# Patient Record
Sex: Male | Born: 1990 | Race: White | Hispanic: No | Marital: Married | Smoking: Current every day smoker
Health system: Southern US, Community
[De-identification: ages and names within clinical notes are randomized; demographics above are authoritative.]

## PROBLEM LIST (undated history)

## (undated) HISTORY — PX: WRIST SURGERY: SHX841

---
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
## 2011-01-21 NOTE — Progress Notes (Signed)
Summary: NECK AND BACK PAIN/SPOT ON NECK (room 4)   Vital Signs:  Patient Profile:   20 Years Old Male CC:      sore on left side neck x 5 days; radiating pain to back; lethargy and general aches Height:     69 inches Weight:      205.50 pounds O2 Sat:      98 % O2 treatment:    Room Air Temp:     99.4 degrees F oral Pulse rate:   95 / minute Resp:     16 per minute BP sitting:   123 / 78  (left arm) Cuff size:   large  Pt. in pain?   yes    Location:   left side neck/ back                   Updated Prior Medication List: No Medications Current Allergies: No known allergies History of Present Illness Chief Complaint: sore on left side neck x 5 days; radiating pain to back; lethargy and general aches History of Present Illness:  Subjective:  Patient complains of onset of a tender bump on his left supraclavicular area about 4 to 5 days ago, followed by surrounding redness that has gradually receded.  He has had associated soreness in his left shoulder and upper left back muscles, fatigue, chills/sweats, and fever to 99.5 for two days.  He has had anorexia, mild nausea without vomiting, and a mild headache.  No arthralgias or myalgias.  No other rash.  No known tick bites althought he has dogs and [place] area.  No GU or respiratory symptoms   REVIEW OF SYSTEMS Constitutional Symptoms      Denies fever, chills, night sweats, weight loss, weight gain, and fatigue.  Eyes       Denies change in vision, eye pain, eye discharge, glasses, contact lenses, and eye surgery. Ear/Nose/Throat/Mouth       Complains of sinus problems.      Denies hearing loss/aids, change in hearing, ear pain, ear discharge, dizziness, frequent runny nose, frequent nose bleeds, sore throat, hoarseness, and tooth pain or bleeding.  Respiratory       Denies dry cough, productive cough, wheezing, shortness of breath, asthma, bronchitis, and emphysema/COPD.  Cardiovascular       Denies murmurs,  chest pain, and tires easily with exhertion.    Gastrointestinal       Denies stomach pain, nausea/vomiting, diarrhea, constipation, blood in bowel movements, and indigestion. Genitourniary       Denies painful urination, kidney stones, and loss of urinary control. Neurological       Denies paralysis, seizures, and fainting/blackouts. Musculoskeletal       Complains of muscle pain, joint pain, and muscle weakness.      Denies joint stiffness, decreased range of motion, redness, swelling, and gout.  Skin       Complains of unusual moles/lumps or sores.      Denies bruising and hair/skin or nail changes.      Comments: left side neck Psych       Denies mood changes, temper/anger issues, anxiety/stress, speech problems, depression, and sleep problems. Other Comments: sore on Left Neck x 5 days ago; progressed to joint/general aches and lethargy with back stiffness   Past History:  Family History: Last updated: 01/03/2010 None  Social History: Last updated: 08/26/2010 Current Smoker 1/2 PPD Alcohol use-yes Drug use-no 3 dogs; [place]  Past Medical History: Reviewed history  from 01/03/2010 and no changes required. Hx of few migraines  Past Surgical History: Denies surgical history  Social History: Current Smoker 1/2 PPD Alcohol use-yes Drug use-no 3 dogs; [place]   Objective:  Appearance:  Patient appears healthy, stated age, and in no acute distress  Eyes:  Pupils are equal, round, and reactive to light and accomodation.  Extraocular movement is intact.  Conjunctivae are not inflamed.  Ears:  Canals normal.  Tympanic membranes normal.   Nose:  Mildly congested turbinates.  No sinus tenderness  Mouth:  No lesions Pharynx:  Normal  Neck:  Supple.  No adenopathy is present.  No thyromegaly is present  Lungs:  Clear to auscultation.  Breath sounds are equal.  Heart:  Regular rate and rhythm without murmurs, rubs, or gallops.  Abdomen:   Nontender without masses or hepatosplenomegaly.  Bowel sounds are present.  No CVA or flank tenderness.  Extremities:  No edema.   Skin:  on the left supraclavicular area is a 5mm by 7mm eschar surrounded by a faint halo of erythema.  No discharge from the lesion.  No fluctuance.  No swelling.  The area is mildly tender but no supraclicular adenopathy palpated.  No axillary adenopathy.  No skin rashes noted otherwise. CBC:  WBC 5.1 ; LY 34.6, MO 11.3, GR 54.1; Hgb 15.1  Assessment New Problems: FATIGUE, ACUTE (ICD-780.79) SKIN LESION (ICD-709.9) FEVER UNSPECIFIED (ICD-780.60)  ? TICK BORNE ILLNESS  Plan New Medications/Changes: CYCLOBENZAPRINE HCL 10 MG TABS (CYCLOBENZAPRINE HCL) One tab by mouth two to three times daily as needed  #20 x 1, 08/26/2010, Donna Christen MD DOXYCYCLINE HYCLATE 100 MG CAPS (DOXYCYCLINE HYCLATE) One by mouth two times a day  #20 x 0, 08/26/2010, Donna Christen MD  New Orders: T-Lyme Disease [phone] T-Rock[place] Mtn Spotted Fever AB IgG IgM [09811-91478] CBC w/Diff [29562-13086] Services provided After hours-Weekends-Holidays [99051] Est. Patient Level IV [57846] Planning Comments:   Check RMSF & Lyme Disease titiers. Empirically begin doxycycline.  Continue Aleve, 2 tabs every 12 hours.  Rest, increase fluid intake. Follow-up with PCP if not improving   The patient and/or caregiver has been counseled thoroughly with regard to medications prescribed including dosage, schedule, interactions, rationale for use, and possible side effects and they verbalize understanding.  Diagnoses and expected course of recovery discussed and will return if not improved as expected or if the condition worsens. Patient and/or caregiver verbalized understanding.  Prescriptions: CYCLOBENZAPRINE HCL 10 MG TABS (CYCLOBENZAPRINE HCL) One tab by mouth two to three times daily as needed  #20 x 1   Entered and Authorized by:   Donna Christen MD   Signed by:   Donna Christen MD on  08/26/2010   Method used:   Print then Give to Patient   RxID:   [id number] DOXYCYCLINE HYCLATE 100 MG CAPS (DOXYCYCLINE HYCLATE) One by mouth two times a day  #20 x 0   Entered and Authorized by:   Donna Christen MD   Signed by:   Donna Christen MD on 08/26/2010   Method used:   Print then Give to Patient   RxID:   [id number]   Orders Added: 1)  T-Lyme Disease [95638-75643] 2)  T-Rocky Mtn Spotted Fever AB IgG IgM [[id number]] 3)  CBC w/Diff [60630-16010] 4)  Services provided After hours-Weekends-Holidays [99051] 5)  Est. Patient Level IV [93235]

## 2011-01-21 NOTE — Telephone Encounter (Signed)
  Phone Note Call from Patient   Caller: Patient Summary of Call: spoke to pt and given lab results. he states that he is feeling better. advised him to call back if he has any questions or concerns. Initial call taken by: Clemens Catholic LPN,  August 31, 2010 4:36 PM Call placed by: Clemens Catholic LPN,  August 31, 2010 4:35 PM Call placed to: Patient

## 2011-09-10 ENCOUNTER — Emergency Department
Admission: EM | Admit: 2011-09-10 | Discharge: 2011-09-10 | Disposition: A | Discharge status: Home / Self Care | Payer: Managed Care, Other (non HMO) | Source: Home / Self Care | Attending: Family Medicine | Admitting: Family Medicine

## 2011-09-10 ENCOUNTER — Encounter: Payer: Self-pay | Admitting: *Deleted

## 2011-09-10 ENCOUNTER — Emergency Department (INDEPENDENT_AMBULATORY_CARE_PROVIDER_SITE_OTHER): Payer: Managed Care, Other (non HMO)

## 2011-09-10 DIAGNOSIS — M545 Low back pain: Secondary | ICD-10-CM

## 2011-09-10 DIAGNOSIS — M546 Pain in thoracic spine: Secondary | ICD-10-CM

## 2011-09-10 DIAGNOSIS — X008XXA Other exposure to uncontrolled fire in building or structure, initial encounter: Secondary | ICD-10-CM

## 2011-09-10 DIAGNOSIS — IMO0002 Reserved for concepts with insufficient information to code with codable children: Secondary | ICD-10-CM

## 2011-09-10 MED ORDER — FLUCONAZOLE 150 MG PO TABS: 150.0000 mg | ORAL_TABLET | Freq: Once | ORAL | Status: AC

## 2011-09-10 MED ORDER — GABAPENTIN 300 MG PO CAPS: ORAL_CAPSULE | ORAL | Status: DC
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
## 2011-09-10 NOTE — ED Notes (Addendum)
Pt c/o low to mid back pain x 2 mths, denies injury.  He states that the pain is worse at night. He has taken aleve with no relief.

## 2011-09-12 ENCOUNTER — Ambulatory Visit: Payer: Managed Care, Other (non HMO) | Admitting: Physical Therapy

## 2012-05-21 ENCOUNTER — Emergency Department
Admission: EM | Admit: 2012-05-21 | Discharge: 2012-05-21 | Disposition: A | Discharge status: Home / Self Care | Payer: Managed Care, Other (non HMO) | Source: Home / Self Care | Attending: Family Medicine | Admitting: Family Medicine

## 2012-05-21 ENCOUNTER — Encounter: Payer: Self-pay | Admitting: *Deleted

## 2012-05-21 DIAGNOSIS — R1013 Epigastric pain: Secondary | ICD-10-CM

## 2012-05-21 DIAGNOSIS — R51 Headache: Secondary | ICD-10-CM

## 2012-05-21 LAB — POCT CBC W AUTO DIFF (K'VILLE URGENT CARE)

## 2012-05-21 MED ORDER — OMEPRAZOLE 40 MG PO CPDR: 40.0000 mg | DELAYED_RELEASE_CAPSULE | Freq: Every day | ORAL | Status: DC

## 2012-05-21 MED ORDER — BUTALBITAL-APAP-CAFFEINE 50-325-40 MG PO TABS: 1.0000 | ORAL_TABLET | Freq: Four times a day (QID) | ORAL | Status: AC | PRN

## 2012-05-21 NOTE — ED Notes (Signed)
Azul c/o lower bilateral abdominal pain x 4 days. Pain is intermittent and only during the day. He has been on Amoxicillin for 7 days now for a uri and has been taking a supplement "Juice Plus" for 1 month. C/o some nausea and diarrhea with the pain. Denies any fever or chills.

## 2012-05-21 NOTE — ED Provider Notes (Signed)
History     CSN: [id number]  Arrival date & time 05/21/12  1159   First MD Initiated Contact with Patient 05/21/12 1241      Chief Complaint  Patient presents with  . Abdominal Pain       HPI Comments: Patient states that he ate a pizza four days ago and developed lower abdominal pain afterwards that lasted about 6 hours.  He has had recurring abdominal discomfort during the day.  He has also had intermittent nausea and loose stools when the pain occurs.  No vomiting.  No fevers, chills, and sweats.  No urinary symptoms.  He states that he started taking amoxicillin about 9 days ago for a respiratory infection and has one capsule left.  No hematochezia. He also complains of flare-up of migraines since he has had the abdominal pain.  Patient is a 22 y.o. male presenting with abdominal pain. The history is provided by the patient.  Abdominal Pain Pain location:  Generalized Pain quality: sharp   Pain radiates to:  Does not radiate Pain severity:  Moderate Onset quality:  Sudden Duration:  4 days Timing:  Intermittent Progression:  Unchanged Chronicity:  New Context: eating   Relieved by:  Nothing Worsened by:  Nothing tried Associated symptoms: diarrhea, fatigue and nausea   Associated symptoms: no chills, no constipation, no dysuria, no fever, no hematemesis, no hematochezia, no hematuria, no melena and no vomiting     History reviewed. No pertinent past medical history.  Past Surgical History  Procedure Laterality Date  . Wrist surgery      Family History  Problem Relation Age of Onset  . Hypertension Father     History  Substance Use Topics  . Smoking status: Current Every Day Smoker -- 0.50 packs/day for 3 years    Types: Cigarettes  . Smokeless tobacco: Never Used  . Alcohol Use: No      Review of Systems  Constitutional: Positive for fatigue. Negative for fever and chills.  Gastrointestinal: Positive for nausea, abdominal pain and diarrhea. Negative for  vomiting, constipation, melena, hematochezia and hematemesis.  Genitourinary: Negative for dysuria and hematuria.    Allergies  Review of patient's allergies indicates no known allergies.  Home Medications   Current Outpatient Rx  Name  Route  Sig  Dispense  Refill  . butalbital-acetaminophen-caffeine (FIORICET) 50-325-40 MG per tablet   Oral   Take 1 tablet by mouth every 6 (six) hours as needed for headache.   15 tablet   0   . omeprazole (PRILOSEC) 40 MG capsule   Oral   Take 1 capsule (40 mg total) by mouth daily. Take 30 minutes before a meal.   15 capsule   0     BP 125/77  Pulse 86  Temp(Src) 98.2 F (36.8 C) (Oral)  Resp 16  Ht 5\' 9"  (1.753 m)  Wt 204 lb (92.534 kg)  BMI 30.11 kg/m2  SpO2 98%  Physical Exam Nursing notes and Vital Signs reviewed. Appearance:  Patient appears healthy, stated age, and in no acute distress Eyes:  Pupils are equal, round, and reactive to light and accomodation.  Extraocular movement is intact.  Conjunctivae are not inflamed  Ears:  Canals normal.  Tympanic membranes normal.  Nose:   Normal turbinates.  No sinus tenderness.   Pharynx:  Normal Neck:  Supple.  No adenopathy Lungs:  Clear to auscultation.  Breath sounds are equal.  Heart:  Regular rate and rhythm without murmurs, rubs, or gallops.  Abdomen:  Mild peri-umbilical tenderness without masses or hepatosplenomegaly.  Bowel sounds are present and increased.  No CVA or flank tenderness.  Extremities:  No edema.  No calf tenderness Skin:  No rash present.   ED Course  Procedures none  Labs Reviewed  POCT CBC W AUTO DIFF ([facility])  WBC 11.9; LY 24.7; MO 9.9; GR 65.4; Hgb 15.8; Platelets 192       1. Abdominal pain, epigastric; ? etiology   2. Headache       MDM  Begin clear liquids for about 12 to 18 hours, then may begin a BRAT diet. Then gradually advance to a regular diet as tolerated.  Discontinue amoxicillin.  Begin eating yogurt with live culture  ("probiotic").  If pain and diarrhea persist, recommend stool culture.  .  If symptoms become significantly worse during the night or over the weekend, proceed to the local emergency room.        Lattie Haw, MD 05/25/12 [phone]

## 2013-01-12 IMAGING — CR DG LUMBAR SPINE COMPLETE 4+V
5 series · 5 of 5 positions shown · non-contrast
Comparison: 09/10/2011

CLINICAL DATA: Back injury 3 months ago, persistent low back pain

LUMBAR SPINE - COMPLETE 4+ VIEW

[view not recorded (1 of 5)]
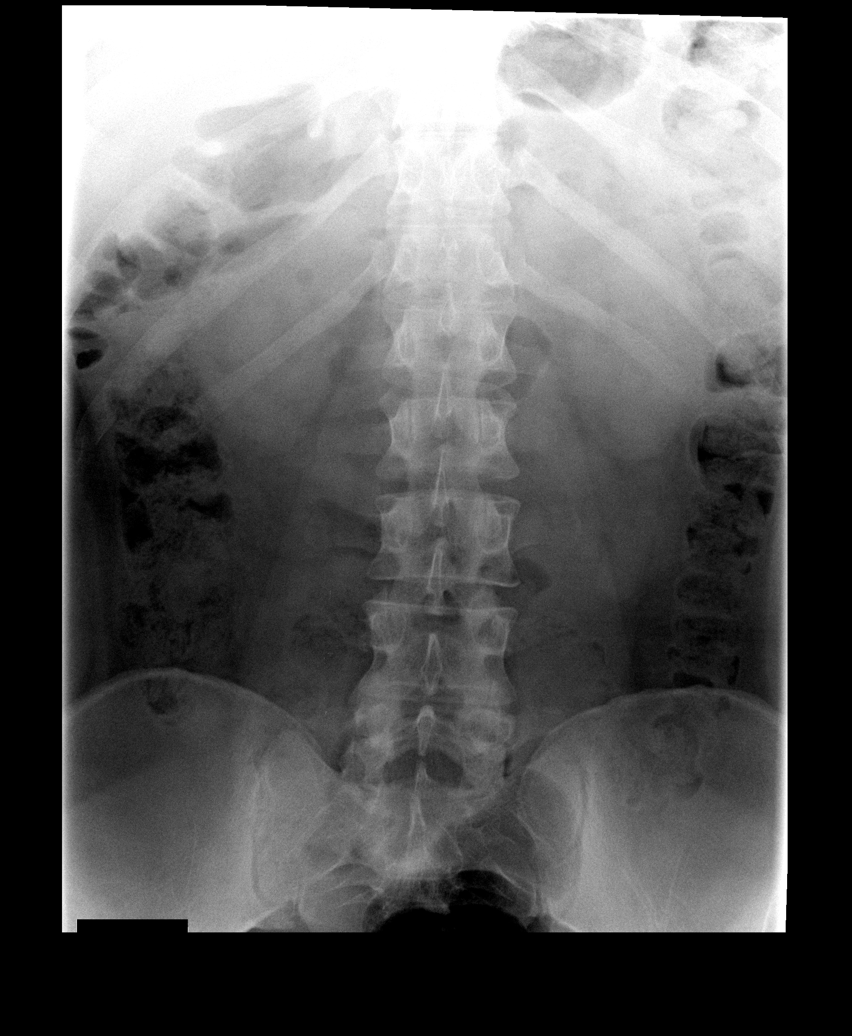

[view not recorded (2 of 5)]
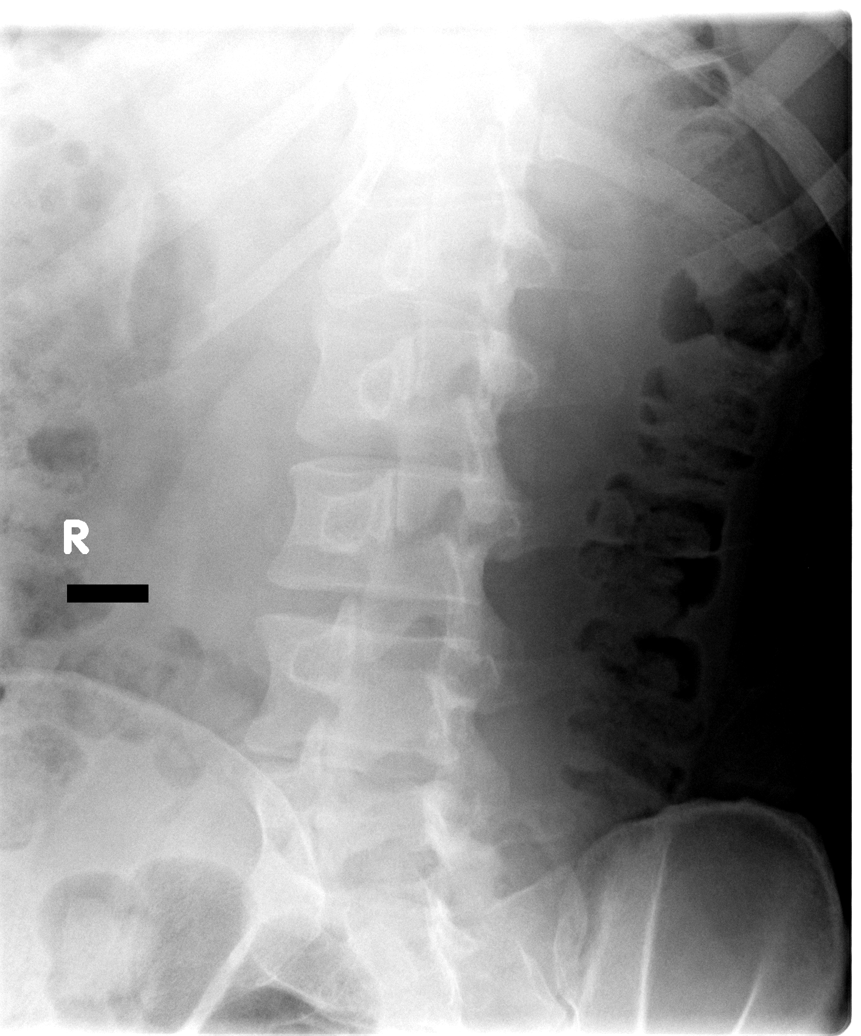

[view not recorded (3 of 5)]
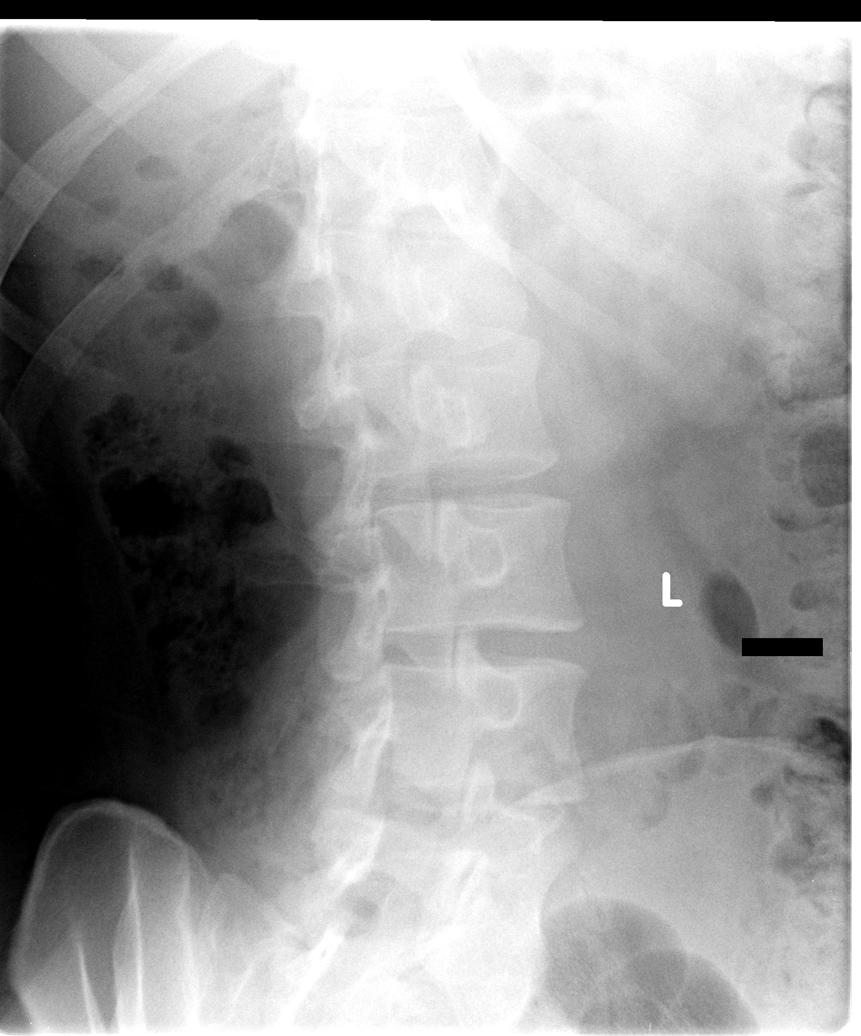

[view not recorded (4 of 5)]
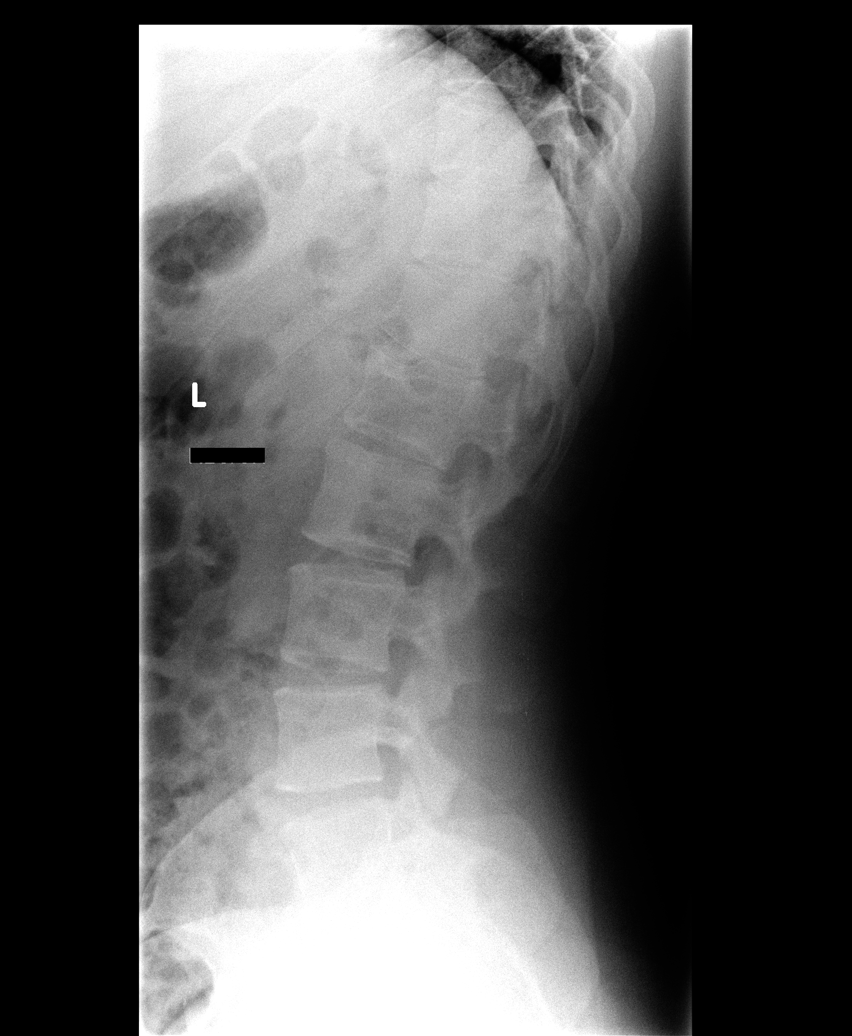

[view not recorded (5 of 5)]
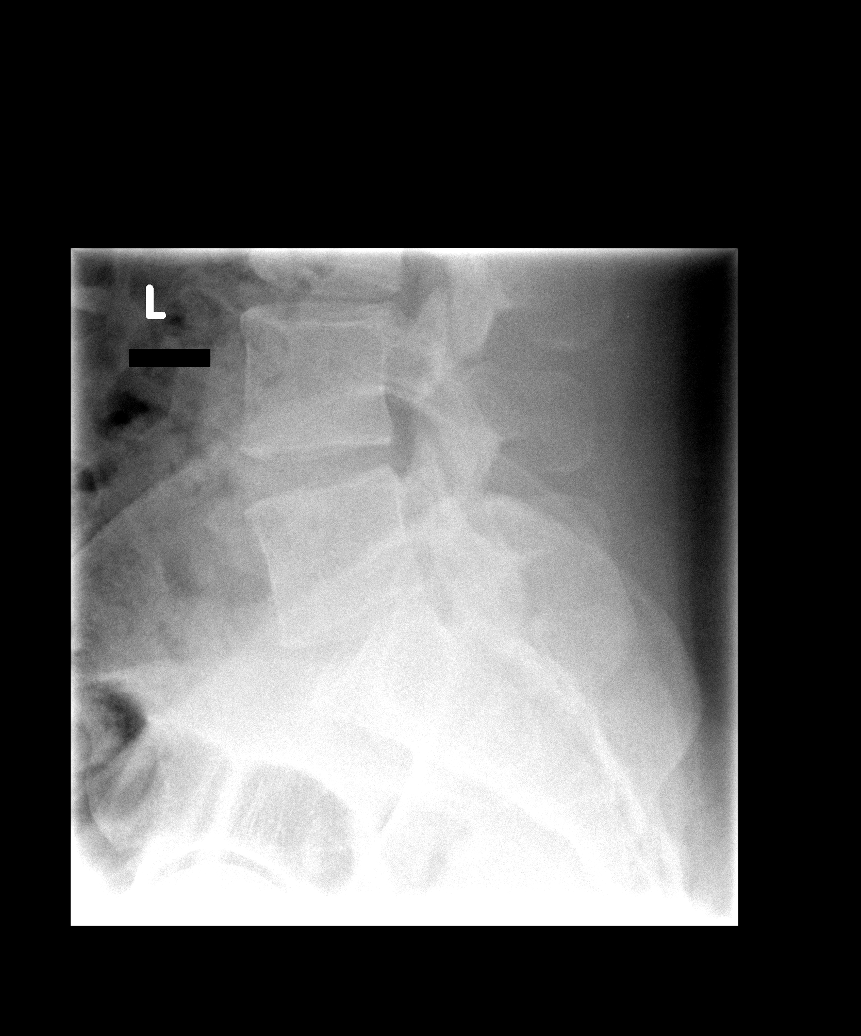

[5 of 5 positions shown; findings below may reference images not displayed]

FINDINGS: Normal alignment without compression fracture, wedge
shaped deformity or focal kyphosis.  Vertebral body heights and
disc spaces preserved.  Facets aligned.  No pars defects.  Normal
pedicles and SI joints.
IMPRESSION: No acute finding

## 2015-01-13 ENCOUNTER — Emergency Department (INDEPENDENT_AMBULATORY_CARE_PROVIDER_SITE_OTHER)
Admission: EM | Admit: 2015-01-13 | Discharge: 2015-01-13 | Disposition: A | Discharge status: Home / Self Care | Payer: PRIVATE HEALTH INSURANCE | Source: Home / Self Care | Attending: Family Medicine | Admitting: Family Medicine

## 2015-01-13 ENCOUNTER — Encounter: Payer: Self-pay | Admitting: Emergency Medicine

## 2015-01-13 DIAGNOSIS — J039 Acute tonsillitis, unspecified: Secondary | ICD-10-CM

## 2015-01-13 LAB — POCT RAPID STREP A (OFFICE): Rapid Strep A Screen: NEGATIVE

## 2015-01-13 MED ORDER — PENICILLIN V POTASSIUM 500 MG PO TABS: ORAL_TABLET | ORAL | Status: AC

## 2015-01-13 MED ORDER — PREDNISONE 20 MG PO TABS: 20.0000 mg | ORAL_TABLET | Freq: Two times a day (BID) | ORAL | Status: AC

## 2015-01-13 NOTE — Discharge Instructions (Signed)
Try warm salt water gargles for sore throat. May take Tylenol as needed for pain.   Tonsillitis Tonsillitis is an infection of the throat that causes the tonsils to become red, tender, and swollen. Tonsils are collections of lymphoid tissue at the back of the throat. Each tonsil has crevices (crypts). Tonsils help fight nose and throat infections and keep infection from spreading to other parts of the body for the first 18 months of life.  CAUSES Sudden (acute) tonsillitis is usually caused by infection with streptococcal bacteria. Long-lasting (chronic) tonsillitis occurs when the crypts of the tonsils become filled with pieces of food and bacteria, which makes it easy for the tonsils to become repeatedly infected. SYMPTOMS  Symptoms of tonsillitis include:  A sore throat, with possible difficulty swallowing.  White patches on the tonsils.  Fever.  Tiredness.  New episodes of snoring during sleep, when you did not snore before.  Small, foul-smelling, yellowish-white pieces of material (tonsilloliths) that you occasionally cough up or spit out. The tonsilloliths can also cause you to have bad breath. DIAGNOSIS Tonsillitis can be diagnosed through a physical exam. Diagnosis can be confirmed with the results of lab tests, including a throat culture. TREATMENT  The goals of tonsillitis treatment include the reduction of the severity and duration of symptoms and prevention of associated conditions. Symptoms of tonsillitis can be improved with the use of steroids to reduce the swelling. Tonsillitis caused by bacteria can be treated with antibiotic medicines. Usually, treatment with antibiotic medicines is started before the cause of the tonsillitis is known. However, if it is determined that the cause is not bacterial, antibiotic medicines will not treat the tonsillitis. If attacks of tonsillitis are severe and frequent, your health care provider may recommend surgery to remove the tonsils  (tonsillectomy). HOME CARE INSTRUCTIONS   Rest as much as possible and get plenty of sleep.  Drink plenty of fluids. While the throat is very sore, eat soft foods or liquids, such as sherbet, soups, or instant breakfast drinks.  Eat frozen ice pops.  Gargle with a warm or cold liquid to help soothe the throat. Mix 1/4 teaspoon of salt and 1/4 teaspoon of baking soda in 8 oz of water. SEEK MEDICAL CARE IF:   Large, tender lumps develop in your neck.  A rash develops.  A green, yellow-brown, or bloody substance is coughed up.  You are unable to swallow liquids or food for 24 hours.  You notice that only one of the tonsils is swollen. SEEK IMMEDIATE MEDICAL CARE IF:   You develop any new symptoms such as vomiting, severe headache, stiff neck, chest pain, or trouble breathing or swallowing.  You have severe throat pain along with drooling or voice changes.  You have severe pain, unrelieved with recommended medications.  You are unable to fully open the mouth.  You develop redness, swelling, or severe pain anywhere in the neck.  You have a fever. MAKE SURE YOU:   Understand these instructions.  Will watch your condition.  Will get help right away if you are not doing well or get worse.   This information is not intended to replace advice given to you by your health care provider. Make sure you discuss any questions you have with your health care provider.   Document Released: 11/14/2004 Document Revised: 02/25/2014 Document Reviewed: 07/24/2012 Elsevier Interactive Patient Education [employer]2016 [employer]

## 2015-01-13 NOTE — ED Notes (Signed)
Pt c/o sore throat for a couple of days, worsen this am when he awoke.  Pt is afebrile.

## 2015-01-13 NOTE — ED Provider Notes (Signed)
CSN: [id number]     Arrival date & time 01/13/15  1015 History   First MD Initiated Contact with Patient 01/13/15 1038     Chief Complaint  Patient presents with  . Sore Throat      HPI Comments: Patient complains of sore throat for several days, worse this morning upon awakening.  No fevers, chills, and sweats.  The history is provided by the patient.    History reviewed. No pertinent past medical history. Past Surgical History  Procedure Laterality Date  . Wrist surgery     Family History  Problem Relation Age of Onset  . Hypertension Father    Social History  Substance Use Topics  . Smoking status: Current Every Day Smoker -- 0.50 packs/day for 3 years    Types: Cigarettes  . Smokeless tobacco: Never Used  . Alcohol Use: No    Review of Systems + sore throat No cough No pleuritic pain No wheezing No nasal congestion No post-nasal drainage No sinus pain/pressure No itchy/red eyes No earache No hemoptysis No SOB No fever/chills No nausea No vomiting No abdominal pain No diarrhea No urinary symptoms No skin rash No fatigue No myalgias No headache Used OTC meds without relief  Allergies  Review of patient's allergies indicates no known allergies.  Home Medications   Prior to Admission medications   Medication Sig Start Date End Date Taking? Authorizing Provider  penicillin v potassium (VEETID) 500 MG tablet Take one tab by mouth twice daily for 10 days 01/13/15   Lattie HawStephen A Beese, MD  predniSONE (DELTASONE) 20 MG tablet Take 1 tablet (20 mg total) by mouth 2 (two) times daily. Take with food. 01/13/15   Lattie HawStephen A Beese, MD   Meds Ordered and Administered this Visit  Medications - No data to display  BP 120/84 mmHg  Pulse 84  Temp(Src) 98.3 F (36.8 C) (Oral)  Ht 5\' 9"  (1.753 m)  Wt 184 lb (83.462 kg)  BMI 27.16 kg/m2  SpO2 99% No data found.   Physical Exam Nursing notes and Vital Signs reviewed. Appearance:  Patient appears stated age, and  in no acute distress Eyes:  Pupils are equal, round, and reactive to light and accomodation.  Extraocular movement is intact.  Conjunctivae are not inflamed  Ears:  Canals normal.  Tympanic membranes normal.   Nose:  Mildly congested turbinates.  No sinus tenderness.  Maxillary sinus tenderness is present.  Pharynx:  Bilateral erythema, more pronounced on the right, with exudate present.  Uvula is erythematous. Neck:  Supple.  Tender large right posterior nodes are palpated. Lungs:  Clear to auscultation.  Breath sounds are equal.  Moving air well. Heart:  Regular rate and rhythm without murmurs, rubs, or gallops.  Abdomen:  Nontender without masses or hepatosplenomegaly.  Bowel sounds are present.  No CVA or flank tenderness.  Extremities:  No edema.   Skin:  No rash present.   ED Course  Procedures  None    Labs Reviewed  STREP A DNA PROBE  POCT RAPID STREP A (OFFICE) negative     MDM   1. Acute tonsillitis, unspecified etiology    Throat culture pending. Concerned about early right tonsillar abscess. Begin PenVK, and prednisone burst. Try warm salt water gargles for sore throat. May take Tylenol as needed for pain. Followup with Family Doctor if not improved in one week.  If symptoms become significantly worse during the night or over the weekend, proceed to the local emergency room.  Lattie Haw, MD 01/19/15 [id number]

## 2015-01-14 ENCOUNTER — Telehealth: Payer: Self-pay | Admitting: Emergency Medicine

## 2015-01-14 LAB — STREP A DNA PROBE: GASP: NOT DETECTED
